# Patient Record
Sex: Male | Born: 1990
Health system: Southern US, Community
[De-identification: ages and names within clinical notes are randomized; demographics above are authoritative.]

## PROBLEM LIST (undated history)

## (undated) DIAGNOSIS — L509 Urticaria, unspecified: Secondary | ICD-10-CM

## (undated) DIAGNOSIS — I10 Essential (primary) hypertension: Secondary | ICD-10-CM

## (undated) HISTORY — DX: Essential (primary) hypertension: I10

## (undated) HISTORY — PX: NO PAST SURGERIES: SHX2092

## (undated) HISTORY — DX: Urticaria, unspecified: L50.9

---

## 2018-07-09 DIAGNOSIS — L509 Urticaria, unspecified: Secondary | ICD-10-CM | POA: Diagnosis not present

## 2018-07-09 DIAGNOSIS — R21 Rash and other nonspecific skin eruption: Secondary | ICD-10-CM | POA: Diagnosis not present

## 2018-07-12 ENCOUNTER — Encounter: Payer: Self-pay | Admitting: Allergy

## 2018-07-12 ENCOUNTER — Ambulatory Visit (INDEPENDENT_AMBULATORY_CARE_PROVIDER_SITE_OTHER): Payer: 59 | Admitting: Allergy

## 2018-07-12 VITALS — BP 130/82 | HR 72 | Temp 98.8°F | Resp 16 | Ht 67.0 in | Wt 233.0 lb

## 2018-07-12 DIAGNOSIS — T7840XD Allergy, unspecified, subsequent encounter: Secondary | ICD-10-CM | POA: Diagnosis not present

## 2018-07-12 DIAGNOSIS — L508 Other urticaria: Secondary | ICD-10-CM | POA: Diagnosis not present

## 2018-07-12 NOTE — Progress Notes (Signed)
New Patient Note  RE: Patrick Travis MRN: [id number] DOB: 09/26/1990 Date of Office Visit: 07/12/2018  Referring provider: No ref. provider found Primary care provider: Abigail Miyamoto, MD   Chief Complaint: hives  History of present illness: Patrick Travis is a 27 y.o. male presenting today for evaluation of urticaria.    2 days ago he broke out in hives around 9pm.  He states this was the first time he has ever had hives in his life.  The hives were on his stomach, flanks, arms, back and top of his thighs.  Hives were itchy.  He states no swelling, no joint aches/pains, hives have not left any marks/bruising.  He does report very mild SOB with the hives but states it wasn't really an issue.  He states the day the hives started he wore a new jacket with polyester that he didn't wash prior to use.  He did not wear the jacket yesterday and did not have hives.  He reports having beef stew for dinner the night the hives started.  The next day he reports having [place] stuffing and also recalls having vienna sausages as well.  He states he has had tick bites years ago but believes they were deer ticks.   He did have hives for 2 days and states on day 2 he had 2 episodes of hives that each lasted about 2 hours but was taking medication as prescribed by ED.   He went to the ED on 07/09/18 and was treated with solumedrol, benadryl and pepcid.  Per ED report his mother found a puncture wound on lt posterior neck thus concerned for reaction after bite. He was prescribed an epipen as well as prednisone pack.  He was also advised to take zyrtec 3 times a day, pepcid 2 times a day and benadyrl prn.  He state she has been taking zyrtec 2 times a day, pepcid once a day with zyrtec once daily, benadryl as needed.  States thought zyrtec 3 times a day was too much.  He has not had any hives today or yesterday.   He denies any new medications prior to onset of hives, no new foods, no change in  soaps/lotions/detergents/body products.  Denies any preceding illnesses.    He does report sneezing and running nose during pollen seasons.  No history of eczema, food allergy.  He states he was told he had childhood asthma but states has not used an inhaler for at least 15-20 years.    Review of systems: Review of Systems  Constitutional: Negative for chills, fever and malaise/fatigue.  HENT: Negative for congestion, ear discharge, ear pain, nosebleeds, sinus pain and sore throat.   Eyes: Negative for pain, discharge and redness.  Respiratory: Positive for shortness of breath. Negative for cough, sputum production and wheezing.   Cardiovascular: Negative for chest pain.  Gastrointestinal: Negative for abdominal pain, constipation, diarrhea, heartburn, nausea and vomiting.  Musculoskeletal: Negative for joint pain.  Skin: Positive for itching and rash.  Neurological: Negative for headaches.    All other systems negative unless noted above in HPI  Past medical history: Past Medical History:  Diagnosis Date  . High blood pressure   . Urticaria     Past surgical history: History reviewed. No pertinent surgical history.  Family history:  Family History  Problem Relation Age of Onset  . High blood pressure Mother   . High blood pressure Father     Social history: Lives in a home with  carpeting with electric heating and central cooling.  1 dog in the home.  No concern for water damage, mildew or roaches in the home.  He is an International aid/development worker.  Has smoking history from 2005-2016 cigarettes 1 pack/day.   Medication List: Allergies as of 07/12/2018   No Known Allergies     Medication List        Accurate as of 07/12/18 10:49 AM. Always use your most recent med list.          diphenhydrAMINE 25 mg capsule Commonly known as:  BENADRYL Take by mouth.   enalapril 10 MG tablet Commonly known as:  VASOTEC TK 1 T PO D   EPINEPHrine 0.3 mg/0.3 mL Soaj injection Commonly  known as:  EPI-PEN STICK INTO MID FRONT THIGH IF LIGHTHEADED OR TROUBLE BREATHING IN SETTING OF ALLERGIC REACTION   ZYRTEC ALLERGY 10 MG tablet Generic drug:  cetirizine Take 10 mg by mouth daily as needed for allergies.       Known medication allergies: No Known Allergies   Physical examination: Blood pressure 130/82, pulse 72, temperature 98.8 F (37.1 C), temperature source Oral, resp. rate 16, height 5\' 7"  (1.702 m), weight 233 lb (105.7 kg).  General: Alert, interactive, in no acute distress. HEENT: PERRLA, TMs pearly gray, turbinates non-edematous without discharge, post-pharynx non erythematous. Neck: Supple without lymphadenopathy. Lungs: Clear to auscultation without wheezing, rhonchi or rales. {no increased work of breathing. CV: Normal S1, S2 without murmurs. Abdomen: Nondistended, nontender. Skin: Warm and dry, without lesions or rashes. Extremities:  No clubbing, cyanosis or edema. Neuro:   Grossly intact.  Diagnositics/Labs: Allergy testing: unable to perform due to recent symptoms and antihistamine use   Assessment and plan:   Hives/Allergic reaction    - Acute hives with no identifiable trigger at this time.  However concerned for possible alpha gal allergy with red meat consumption day of onset of hives   - will obtain following labs: alpha gal panel, tryptase level, environment panel   - at this time would continue Zyrtec 10mg  1 tablet twice a day with Pepcid 1 tablet twice a day.   Reserve benadryl for breakthrough symptoms   - follow emergency action plan in case of severe symptoms and you have access to an Epipen   - pending labs you may warrant patch testing to determine if symptoms are triggered by the jacket however will need to be off prednisone for about 4-6 weeks before patch testing could be performed.    - also if skin testing is needed will need to wait 4-6 weeks from initial reaction and would need to hold all antihistamines for 3 days prior to  any skin testing visit.    Follow-up 2-3 months or sooner if needed  I appreciate the opportunity to take part in Zadkiel's care. Please do not hesitate to contact me with questions.  Sincerely,   Margo Aye, MD Allergy/Immunology [facility]

## 2018-07-12 NOTE — Patient Instructions (Signed)
Hives/Allergic reaction    - Acute hives with no identifiable trigger at this time.  However concerned for possible alpha gal allergy with red meat consumption day of onset of hives   - will obtain following labs: alpha gal panel, tryptase level, environment panel   - at this time would continue Zyrtec 10mg  1 tablet twice a day with Pepcid 1 tablet twice a day.   Reserve benadryl for breakthrough symptoms   - follow emergency action plan in case of severe symptoms and you have access to an Epipen   - pending labs you may warrant patch testing to determine if symptoms are triggered by the jacket however will need to be off prednisone for about 4-6 weeks before patch testing could be performed.    - also if skin testing is needed will need to wait 4-6 weeks from initial reaction and would need to hold all antihistamines for 3 days prior to any skin testing visit.    Follow-up 2-3 months or sooner if needed

## 2018-07-16 LAB — ALLERGENS W/TOTAL IGE AREA 2
Aspergillus Fumigatus IgE: <0.1 kU/L
Bermuda Grass IgE: <0.1 kU/L
COCKROACH, GERMAN IGE: 4.23 kU/L — AB
Cat Dander IgE: <0.1 kU/L
Cladosporium Herbarum IgE: <0.1 kU/L
Cottonwood IgE: <0.1 kU/L
D001-IGE D PTERONYSSINUS: 14.8 kU/L — AB
D002-IGE D FARINAE: 10.3 kU/L — AB
Dog Dander IgE: 0.16 kU/L — AB
Elm, American IgE: <0.1 kU/L
IgE (Immunoglobulin E), Serum: 722 IU/mL — ABNORMAL HIGH (ref 6–495)
Johnson Grass IgE: <0.1 kU/L
Pecan, Hickory IgE: <0.1 kU/L
Penicillium Chrysogen IgE: <0.1 kU/L
Pigweed, Rough IgE: <0.1 kU/L
T006-IGE CEDAR, MOUNTAIN: 0.19 kU/L — AB
T007-IGE OAK, WHITE: 0.1 kU/L — AB
Timothy Grass IgE: 0.14 kU/L — AB
W001-IGE RAGWEED, SHORT: 1.1 kU/L — AB
White Mulberry IgE: <0.1 kU/L

## 2018-07-16 LAB — ALPHA-GAL PANEL
Beef (Bos spp) IgE: <0.1 kU/L (ref <0.35)
Class Interpretation: 0
LAMB CLASS INTERPRETATION: 0
Lamb/Mutton (Ovis spp) IgE: <0.1 kU/L (ref <0.35)
PORK CLASS INTERPRETATION: 0

## 2018-07-16 LAB — TRYPTASE: Tryptase: 6.4 ug/L (ref 2.2–13.2)

## 2018-07-21 ENCOUNTER — Telehealth: Payer: Self-pay | Admitting: Allergy

## 2018-07-21 NOTE — Telephone Encounter (Signed)
Patrick Travis called and would like the results to him blood work.

## 2018-10-26 DIAGNOSIS — I10 Essential (primary) hypertension: Secondary | ICD-10-CM | POA: Diagnosis not present

## 2018-12-02 DIAGNOSIS — H65192 Other acute nonsuppurative otitis media, left ear: Secondary | ICD-10-CM | POA: Diagnosis not present

## 2020-01-11 ENCOUNTER — Other Ambulatory Visit: Payer: Self-pay | Admitting: Legal Medicine

## 2020-02-08 ENCOUNTER — Telehealth (INDEPENDENT_AMBULATORY_CARE_PROVIDER_SITE_OTHER): Payer: 59 | Admitting: Legal Medicine

## 2020-02-08 ENCOUNTER — Encounter: Payer: Self-pay | Admitting: Legal Medicine

## 2020-02-08 VITALS — Temp 99.1°F | Ht 69.0 in | Wt 240.0 lb

## 2020-02-08 DIAGNOSIS — J0100 Acute maxillary sinusitis, unspecified: Secondary | ICD-10-CM

## 2020-02-08 MED ORDER — PREDNISONE 5 MG PO TABS: 5.0000 mg | ORAL_TABLET | Freq: Every day | ORAL | 0 refills | Status: DC

## 2020-02-08 MED ORDER — CLARITHROMYCIN 500 MG PO TABS: 500.0000 mg | ORAL_TABLET | Freq: Two times a day (BID) | ORAL | 0 refills | Status: DC

## 2020-02-08 NOTE — Progress Notes (Signed)
Virtual Visit via Telephone Note   This visit type was conducted due to national recommendations for restrictions regarding the COVID-19 Pandemic (e.g. social distancing) in an effort to limit this patient's exposure and mitigate transmission in our community.  Due to his co-morbid illnesses, this patient is at least at moderate risk for complications without adequate follow up.  This format is felt to be most appropriate for this patient at this time.  The patient did not have access to video technology/had technical difficulties with video requiring transitioning to audio format only (telephone).  All issues noted in this document were discussed and addressed.  No physical exam could be performed with this format.  Patient verbally consented to a telehealth visit.   Date:  02/08/2020   ID:  Patrick Travis, DOB [birth year], MRN [id number]  Patient Location: Home Provider Location: Office  PCP:  Abigail Miyamoto, MD   Evaluation Performed:  New Patient Evaluation  Chief Complaint:  Sinusitis and cough  History of Present Illness:    Patrick Travis is a 29 y.o. male with sinus congestion and sore throat.  Started 4 days ago.  Cold chills. Cough in non productive bur keeps him up at night.  The patient does not have symptoms concerning for COVID-19 infection (fever, chills, cough, or new shortness of breath).    Past Medical History:  Diagnosis Date  . High blood pressure   . Urticaria     History reviewed. No pertinent surgical history.  Family History  Problem Relation Age of Onset  . High blood pressure Mother   . High blood pressure Father     Social History   Socioeconomic History  . Marital status: Single    Spouse name: Not on file  . Number of children: Not on file  . Years of education: Not on file  . Highest education level: Not on file  Occupational History  . Not on file  Tobacco Use  . Smoking status: Former Smoker    Packs/day: 1.00    Years: 11.00   Pack years: 11.00    Types: Cigarettes    Quit date: 2016    Years since quitting: 5.4  . Smokeless tobacco: Never Used  Substance and Sexual Activity  . Alcohol use: Yes    Comment: Seldom  . Drug use: Never  . Sexual activity: Not on file  Other Topics Concern  . Not on file  Social History Narrative  . Not on file   Social Determinants of Health   Financial Resource Strain:   . Difficulty of Paying Living Expenses:   Food Insecurity:   . Worried About Programme researcher, broadcasting/film/video in the Last Year:   . Barista in the Last Year:   Transportation Needs:   . Freight forwarder (Medical):   Marland Kitchen Lack of Transportation (Non-Medical):   Physical Activity:   . Days of Exercise per Week:   . Minutes of Exercise per Session:   Stress:   . Feeling of Stress :   Social Connections:   . Frequency of Communication with Friends and Family:   . Frequency of Social Gatherings with Friends and Family:   . Attends Religious Services:   . Active Member of Clubs or Organizations:   . Attends Banker Meetings:   Marland Kitchen Marital Status:   Intimate Partner Violence:   . Fear of Current or Ex-Partner:   . Emotionally Abused:   Marland Kitchen Physically Abused:   . Sexually  Abused:     Outpatient Medications Prior to Visit  Medication Sig Dispense Refill  . cetirizine (ZYRTEC ALLERGY) 10 MG tablet Take 10 mg by mouth daily as needed for allergies.    . diphenhydrAMINE (BENADRYL) 25 mg capsule Take by mouth.    . enalapril (VASOTEC) 10 MG tablet TAKE 1 TABLET(10 MG) BY MOUTH 1 TIME PER DAY 30 tablet 3  . EPINEPHrine 0.3 mg/0.3 mL IJ SOAJ injection STICK INTO MID FRONT THIGH IF LIGHTHEADED OR TROUBLE BREATHING IN SETTING OF ALLERGIC REACTION  0   No facility-administered medications prior to visit.   .med Allergies:   Patient has no known allergies.   Social History   Tobacco Use  . Smoking status: Former Smoker    Packs/day: 1.00    Years: 11.00    Pack years: 11.00    Types:  Cigarettes    Quit date: 2016    Years since quitting: 5.4  . Smokeless tobacco: Never Used  Substance Use Topics  . Alcohol use: Yes    Comment: Seldom  . Drug use: Never     Review of Systems  Constitutional: Positive for chills and malaise/fatigue.  HENT: Positive for congestion, sinus pain and sore throat.   Respiratory: Positive for cough.   Cardiovascular: Negative.   Gastrointestinal: Negative.   Genitourinary: Negative.   Musculoskeletal: Negative.      Labs/Other Tests and Data Reviewed:    Recent Labs: No results found for requested labs within last 8760 hours.   Recent Lipid Panel No results found for: CHOL, TRIG, HDL, CHOLHDL, LDLCALC, LDLDIRECT  Wt Readings from Last 3 Encounters:  02/08/20 240 lb (108.9 kg)  07/12/18 233 lb (105.7 kg)     Objective:    Vital Signs:  Temp 99.1 F (37.3 C)   Ht 5\' 9"  (1.753 m)   Wt 240 lb (108.9 kg)   BMI 35.44 kg/m    Physical Exam VS reviewed  ASSESSMENT & PLAN:   Acute Sinusitis: Patient is having sinus congestion and cough with sore throat, no COVID exposure.  I called in Biaxin and prednisone pack.  Use robitussin DM  No orders of the defined types were placed in this encounter.    Meds ordered this encounter  Medications  . clarithromycin (BIAXIN) 500 MG tablet    Sig: Take 1 tablet (500 mg total) by mouth 2 (two) times daily.    Dispense:  20 tablet    Refill:  0  . predniSONE (DELTASONE) 5 MG tablet    Sig: Take 1 tablet (5 mg total) by mouth daily with breakfast. Take 6ills first day , then 5 pills day 2 and then cut down one pill day until gone    Dispense:  21 tablet    Refill:  0    COVID-19 Education: The signs and symptoms of COVID-19 were discussed with the patient and how to seek care for testing (follow up with PCP or arrange E-visit). The importance of social distancing was discussed today.  Time:   Today, I have spent 20 minutes with the patient with telehealth technology discussing the  above problems.    Follow Up:  In Person prn  Signed, Reinaldo Meeker, MD  02/08/2020 9:41 AM    [place]

## 2020-04-17 ENCOUNTER — Other Ambulatory Visit: Payer: Self-pay | Admitting: Legal Medicine

## 2020-05-06 ENCOUNTER — Ambulatory Visit (INDEPENDENT_AMBULATORY_CARE_PROVIDER_SITE_OTHER): Payer: 59 | Admitting: Legal Medicine

## 2020-05-06 ENCOUNTER — Encounter: Payer: Self-pay | Admitting: Legal Medicine

## 2020-05-06 VITALS — Ht 69.0 in | Wt 240.0 lb

## 2020-05-06 DIAGNOSIS — J028 Acute pharyngitis due to other specified organisms: Secondary | ICD-10-CM

## 2020-05-06 DIAGNOSIS — J029 Acute pharyngitis, unspecified: Secondary | ICD-10-CM | POA: Insufficient documentation

## 2020-05-06 MED ORDER — PREDNISONE 10 MG (21) PO TBPK: ORAL_TABLET | ORAL | 0 refills | Status: DC

## 2020-05-06 MED ORDER — AZITHROMYCIN 250 MG PO TABS: ORAL_TABLET | ORAL | 0 refills | Status: DC

## 2020-05-06 NOTE — Progress Notes (Signed)
Virtual Visit via Telephone Note   This visit type was conducted due to national recommendations for restrictions regarding the COVID-19 Pandemic (e.g. social distancing) in an effort to limit this patient's exposure and mitigate transmission in our community.  Due to his co-morbid illnesses, this patient is at least at moderate risk for complications without adequate follow up.  This format is felt to be most appropriate for this patient at this time.  The patient did not have access to video technology/had technical difficulties with video requiring transitioning to audio format only (telephone).  All issues noted in this document were discussed and addressed.  No physical exam could be performed with this format.  Patient verbally consented to a telehealth visit.   Date:  05/06/2020   ID:  Patrick Travis, DOB May 18, 1991, MRN [id number]  Patient Location: Home Provider Location: Office/Clinic  PCP:  Abigail Miyamoto, MD   Evaluation Performed:  New Patient Evaluation  Chief Complaint:  Sore throat 3 days  History of Present Illness:    Patrick Travis is a 29 y.o. male with sore throat for 3 days with hoarseness.  No fever or chills.  The patient does not have symptoms concerning for COVID-19 infection (fever, chills, cough, or new shortness of breath).    Past Medical History:  Diagnosis Date  . High blood pressure   . Urticaria     History reviewed. No pertinent surgical history.  Family History  Problem Relation Age of Onset  . High blood pressure Mother   . High blood pressure Father     Social History   Socioeconomic History  . Marital status: Single    Spouse name: Not on file  . Number of children: Not on file  . Years of education: Not on file  . Highest education level: Not on file  Occupational History  . Not on file  Tobacco Use  . Smoking status: Former Smoker    Packs/day: 1.00    Years: 11.00    Pack years: 11.00    Types: Cigarettes    Quit  date: 2016    Years since quitting: 5.6  . Smokeless tobacco: Never Used  Vaping Use  . Vaping Use: Every day  Substance and Sexual Activity  . Alcohol use: Yes    Comment: Seldom  . Drug use: Never  . Sexual activity: Not Currently  Other Topics Concern  . Not on file  Social History Narrative  . Not on file   Social Determinants of Health   Financial Resource Strain:   . Difficulty of Paying Living Expenses: Not on file  Food Insecurity:   . Worried About Programme researcher, broadcasting/film/video in the Last Year: Not on file  . Ran Out of Food in the Last Year: Not on file  Transportation Needs:   . Lack of Transportation (Medical): Not on file  . Lack of Transportation (Non-Medical): Not on file  Physical Activity:   . Days of Exercise per Week: Not on file  . Minutes of Exercise per Session: Not on file  Stress:   . Feeling of Stress : Not on file  Social Connections:   . Frequency of Communication with Friends and Family: Not on file  . Frequency of Social Gatherings with Friends and Family: Not on file  . Attends Religious Services: Not on file  . Active Member of Clubs or Organizations: Not on file  . Attends Banker Meetings: Not on file  . Marital Status: Not  on file  Intimate Partner Violence:   . Fear of Current or Ex-Partner: Not on file  . Emotionally Abused: Not on file  . Physically Abused: Not on file  . Sexually Abused: Not on file    Outpatient Medications Prior to Visit  Medication Sig Dispense Refill  . cetirizine (ZYRTEC ALLERGY) 10 MG tablet Take 10 mg by mouth daily as needed for allergies.    . diphenhydrAMINE (BENADRYL) 25 mg capsule Take by mouth.    . enalapril (VASOTEC) 10 MG tablet TAKE 1 TABLET(10 MG) BY MOUTH EVERY DAY 30 tablet 3  . EPINEPHrine 0.3 mg/0.3 mL IJ SOAJ injection STICK INTO MID FRONT THIGH IF LIGHTHEADED OR TROUBLE BREATHING IN SETTING OF ALLERGIC REACTION  0  . clarithromycin (BIAXIN) 500 MG tablet Take 1 tablet (500 mg total) by  mouth 2 (two) times daily. 20 tablet 0  . predniSONE (DELTASONE) 5 MG tablet Take 1 tablet (5 mg total) by mouth daily with breakfast. Take 6ills first day , then 5 pills day 2 and then cut down one pill day until gone 21 tablet 0   No facility-administered medications prior to visit.   .med Allergies:   Amoxicillin   Social History   Tobacco Use  . Smoking status: Former Smoker    Packs/day: 1.00    Years: 11.00    Pack years: 11.00    Types: Cigarettes    Quit date: 2016    Years since quitting: 5.6  . Smokeless tobacco: Never Used  Vaping Use  . Vaping Use: Every day  Substance Use Topics  . Alcohol use: Yes    Comment: Seldom  . Drug use: Never     Review of Systems  Constitutional: Negative for chills and fever.  HENT: Positive for congestion and sore throat.   Eyes: Negative.   Respiratory: Positive for cough. Negative for shortness of breath and wheezing.   Cardiovascular: Negative for chest pain and palpitations.  Gastrointestinal: Negative.   Genitourinary: Negative.   Musculoskeletal: Negative.   Neurological: Negative.   Psychiatric/Behavioral: Negative.      Labs/Other Tests and Data Reviewed:    Recent Labs: No results found for requested labs within last 8760 hours.   Recent Lipid Panel No results found for: CHOL, TRIG, HDL, CHOLHDL, LDLCALC, LDLDIRECT  Wt Readings from Last 3 Encounters:  05/06/20 240 lb (108.9 kg)  02/08/20 240 lb (108.9 kg)  07/12/18 233 lb (105.7 kg)     Objective:    Vital Signs:  Ht 5\' 9"  (1.753 m)   Wt 240 lb (108.9 kg)   BMI 35.44 kg/m    Physical Exam VS reviewed  ASSESSMENT & PLAN:   Diagnoses and all orders for this visit: Pharyngitis due to other organism -     azithromycin (ZITHROMAX) 250 MG tablet; 2 tablets on day 1, then 1 tablet daily on days 2-6. -     predniSONE (STERAPRED UNI-PAK 21 TAB) 10 MG (21) TBPK tablet; Take 6ills first day , then 5 pills day 2 and then cut down one pill day until gone  He  is to gargle and can use tylenol or ibuprofen.  Cal it fever occurs or worsening symptoms      COVID-19 Education: The signs and symptoms of COVID-19 were discussed with the patient and how to seek care for testing (follow up with PCP or arrange E-visit). The importance of social distancing was discussed today.  Time:   Today, I have spent 20 minutes with the  patient with telehealth technology discussing the above problems.    Follow Up:  In Person prn  Signed, Brent Bulla, MD  05/06/2020 10:45 AM    [facility]

## 2020-08-31 ENCOUNTER — Other Ambulatory Visit: Payer: Self-pay | Admitting: Legal Medicine

## 2021-03-11 ENCOUNTER — Other Ambulatory Visit: Payer: Self-pay

## 2021-03-11 ENCOUNTER — Ambulatory Visit (HOSPITAL_COMMUNITY)
Admission: EM | Admit: 2021-03-11 | Discharge: 2021-03-11 | Disposition: A | Discharge status: Home / Self Care | Payer: 59 | Attending: Emergency Medicine | Admitting: Emergency Medicine

## 2021-03-11 ENCOUNTER — Ambulatory Visit (INDEPENDENT_AMBULATORY_CARE_PROVIDER_SITE_OTHER): Payer: 59

## 2021-03-11 ENCOUNTER — Encounter (HOSPITAL_COMMUNITY): Payer: Self-pay

## 2021-03-11 DIAGNOSIS — M79675 Pain in left toe(s): Secondary | ICD-10-CM | POA: Diagnosis not present

## 2021-03-11 DIAGNOSIS — S92535B Nondisplaced fracture of distal phalanx of left lesser toe(s), initial encounter for open fracture: Secondary | ICD-10-CM

## 2021-03-11 NOTE — ED Provider Notes (Signed)
[facility]    CSN: [id number] Arrival date & time: 03/11/21  1017      History   Chief Complaint Chief Complaint  Patient presents with   Toe Injury    HPI Kelli Egolf is a 30 y.o. male.   Patient presents with 4th left toe bruising, decreased sensation, limited ROM and pain when bearing weight after toe hit rock 3 days ago. Has not attempted treatment.    Past Medical History:  Diagnosis Date   High blood pressure    Urticaria     Patient Active Problem List   Diagnosis Date Noted   Pharyngitis 05/06/2020    History reviewed. No pertinent surgical history.     Home Medications    Prior to Admission medications   Medication Sig Start Date End Date Taking? Authorizing Provider  azithromycin (ZITHROMAX) 250 MG tablet 2 tablets on day 1, then 1 tablet daily on days 2-6. 05/06/20   Abigail Miyamoto, MD  cetirizine (ZYRTEC ALLERGY) 10 MG tablet Take 10 mg by mouth daily as needed for allergies.    [provider]  diphenhydrAMINE (BENADRYL) 25 mg capsule Take by mouth.    [provider]  enalapril (VASOTEC) 10 MG tablet TAKE 1 TABLET(10 MG) BY MOUTH EVERY DAY 08/31/20   Abigail Miyamoto, MD  EPINEPHrine 0.3 mg/0.3 mL IJ SOAJ injection STICK INTO MID FRONT THIGH IF LIGHTHEADED OR TROUBLE BREATHING IN SETTING OF ALLERGIC REACTION 07/10/18   [provider]  predniSONE (STERAPRED UNI-PAK 21 TAB) 10 MG (21) TBPK tablet Take 6ills first day , then 5 pills day 2 and then cut down one pill day until gone 05/06/20   Abigail Miyamoto, MD    Family History Family History  Problem Relation Age of Onset   High blood pressure Mother    High blood pressure Father     Social History Social History   Tobacco Use   Smoking status: Former    Packs/day: 1.00    Years: 11.00    Pack years: 11.00    Types: Cigarettes    Quit date: 2016    Years since quitting: 6.5   Smokeless tobacco: Never  Vaping Use   Vaping Use:  Every day  Substance Use Topics   Alcohol use: Yes    Comment: Seldom   Drug use: Never     Allergies   Amoxicillin   Review of Systems Review of Systems  Constitutional: Negative.   Respiratory: Negative.    Cardiovascular: Negative.   Musculoskeletal:  Positive for gait problem and joint swelling. Negative for arthralgias, back pain, myalgias, neck pain and neck stiffness.  Skin: Negative.   Neurological:  Positive for numbness. Negative for dizziness, tremors, seizures, syncope, facial asymmetry, speech difficulty, weakness, light-headedness and headaches.    Physical Exam Triage Vital Signs ED Triage Vitals  Enc Vitals Group     BP 03/11/21 1120 132/80     Pulse Rate 03/11/21 1120 90     Resp 03/11/21 1120 18     Temp 03/11/21 1120 98.4 F (36.9 C)     Temp Source 03/11/21 1120 Oral     SpO2 03/11/21 1120 100 %     Weight --      Height --      Head Circumference --      Peak Flow --      Pain Score 03/11/21 1122 7     Pain Loc --      Pain Edu? --  Excl. in GC? --    No data found.  Updated Vital Signs BP 132/80 (BP Location: Right Arm)   Pulse 90   Temp 98.4 F (36.9 C) (Oral)   Resp 18   SpO2 100%   Visual Acuity Right Eye Distance:   Left Eye Distance:   Bilateral Distance:    Right Eye Near:   Left Eye Near:    Bilateral Near:     Physical Exam Constitutional:      Appearance: Normal appearance. He is normal weight.  HENT:     Head: Normocephalic.  Eyes:     Extraocular Movements: Extraocular movements intact.  Pulmonary:     Effort: Pulmonary effort is normal.  Musculoskeletal:     Comments: Bruising over dorsum of 4th left toe, limited range of motion due to pain, decreased sensation over dorsum of 4th toe and at base of 4th metatarsal, swelling noted at base of metatarsal   Neurological:     Mental Status: He is alert and oriented to person, place, and time. Mental status is at baseline.  Psychiatric:        Mood and Affect:  Mood normal.        Behavior: Behavior normal.     UC Treatments / Results  Labs (all labs ordered are listed, but only abnormal results are displayed) Labs Reviewed - No data to display  EKG   Radiology No results found.  Procedures Procedures (including critical care time)  Medications Ordered in UC Medications - No data to display  Initial Impression / Assessment and Plan / UC Course  I have reviewed the triage vital signs and the nursing notes.  Pertinent labs & imaging results that were available during my care of the patient were reviewed by me and considered in my medical decision making (see chart for details).  Nondisplaced fracture of distal phalanx of 4th left toe  X-ray to confirm fracture   Buddy tape with post op shoe until seen by orthopedic specialist Declined prescription, will take otc ibuprofen 600- 800 mg tid prn  Orthopedic referral given to patient   Final Clinical Impressions(s) / UC Diagnoses   Final diagnoses:  None   Discharge Instructions   None    ED Prescriptions   None    PDMP not reviewed this encounter.   Valinda Hoar, NP 03/11/21 1316

## 2021-03-11 NOTE — Discharge Instructions (Addendum)
Your fracture is at the distal phalanx of your 4th toe   Follow up with orthopedic specialist within the next 2 weeks for evaluation   Wear buddy tape until seen by orthopedic specialist, wear shoe while walking or standing  Can use 600-800 mg of ibuprofen every 8 hours as needed for pain, take with food to prevent stomach irritation

## 2021-03-11 NOTE — ED Triage Notes (Signed)
Pt presents with left 4th toe injury [place].

## 2021-04-22 ENCOUNTER — Encounter: Payer: Self-pay | Admitting: Legal Medicine

## 2021-04-22 ENCOUNTER — Telehealth (INDEPENDENT_AMBULATORY_CARE_PROVIDER_SITE_OTHER): Payer: Self-pay | Admitting: Legal Medicine

## 2021-04-22 VITALS — BP 131/82 | HR 89 | Temp 103.7°F | Ht 69.0 in | Wt 240.0 lb

## 2021-04-22 DIAGNOSIS — R6889 Other general symptoms and signs: Secondary | ICD-10-CM

## 2021-04-22 DIAGNOSIS — Z9189 Other specified personal risk factors, not elsewhere classified: Secondary | ICD-10-CM

## 2021-04-22 DIAGNOSIS — J019 Acute sinusitis, unspecified: Secondary | ICD-10-CM

## 2021-04-22 LAB — POCT INFLUENZA A/B
Influenza A, POC: NEGATIVE
Influenza B, POC: NEGATIVE

## 2021-04-22 LAB — POC COVID19 BINAXNOW: SARS Coronavirus 2 Ag: NEGATIVE

## 2021-04-22 MED ORDER — AZITHROMYCIN 250 MG PO TABS: ORAL_TABLET | ORAL | 0 refills | Status: AC

## 2021-04-22 MED ORDER — PREDNISONE 10 MG (21) PO TBPK: ORAL_TABLET | ORAL | 0 refills | Status: DC

## 2021-04-22 NOTE — Progress Notes (Signed)
Virtual Visit via Telephone Note   This visit type was conducted due to national recommendations for restrictions regarding the COVID-19 Pandemic (e.g. social distancing) in an effort to limit this patient's exposure and mitigate transmission in our community.  Due to his co-morbid illnesses, this patient is at least at moderate risk for complications without adequate follow up.  This format is felt to be most appropriate for this patient at this time.  The patient did not have access to video technology/had technical difficulties with video requiring transitioning to audio format only (telephone).  All issues noted in this document were discussed and addressed.  No physical exam could be performed with this format.  Patient verbally consented to a telehealth visit.   Date:  04/22/2021   ID:  Patrick Travis, DOB 1990-11-22, MRN [id number]  Patient Location: Home Provider Location: Office/Clinic  PCP:  Abigail Miyamoto, MD   Evaluation Performed:  New Patient Evaluation  Chief Complaint:  flu-like syndrome  History of Present Illness:    Patrick Travis is a 30 y.o. male with negative covid but 2 days with sudden onset of fever 103, myalgias and nonproductive cough, headache.  The patient does have symptoms concerning for COVID-19 infection (fever, chills, cough, or new shortness of breath).    Past Medical History:  Diagnosis Date   High blood pressure    Urticaria     History reviewed. No pertinent surgical history.  Family History  Problem Relation Age of Onset   High blood pressure Mother    High blood pressure Father     Social History   Socioeconomic History   Marital status: Single    Spouse name: Not on file   Number of children: Not on file   Years of education: Not on file   Highest education level: Not on file  Occupational History   Not on file  Tobacco Use   Smoking status: Former    Packs/day: 1.00    Years: 11.00    Pack years: 11.00    Types:  Cigarettes    Quit date: 2016    Years since quitting: 6.6   Smokeless tobacco: Never  Vaping Use   Vaping Use: Every day  Substance and Sexual Activity   Alcohol use: Yes    Comment: Seldom   Drug use: Never   Sexual activity: Not Currently  Other Topics Concern   Not on file  Social History Narrative   Not on file   Social Determinants of Health   Financial Resource Strain: Not on file  Food Insecurity: Not on file  Transportation Needs: Not on file  Physical Activity: Not on file  Stress: Not on file  Social Connections: Not on file  Intimate Partner Violence: Not on file    Outpatient Medications Prior to Visit  Medication Sig Dispense Refill   cetirizine (ZYRTEC) 10 MG tablet Take 10 mg by mouth daily as needed for allergies.     diphenhydrAMINE (BENADRYL) 25 mg capsule Take by mouth.     enalapril (VASOTEC) 10 MG tablet TAKE 1 TABLET(10 MG) BY MOUTH EVERY DAY 90 tablet 2   EPINEPHrine 0.3 mg/0.3 mL IJ SOAJ injection STICK INTO MID FRONT THIGH IF LIGHTHEADED OR TROUBLE BREATHING IN SETTING OF ALLERGIC REACTION  0   No facility-administered medications prior to visit.    Allergies:   Amoxicillin   Social History   Tobacco Use   Smoking status: Former    Packs/day: 1.00    Years: 11.00  Pack years: 11.00    Types: Cigarettes    Quit date: 2016    Years since quitting: 6.6   Smokeless tobacco: Never  Vaping Use   Vaping Use: Every day  Substance Use Topics   Alcohol use: Yes    Comment: Seldom   Drug use: Never     Review of Systems  Constitutional:  Positive for chills, fever and malaise/fatigue.  HENT:  Positive for congestion.   Eyes: Negative.   Respiratory:  Positive for cough.   Cardiovascular: Negative.   Gastrointestinal: Negative.   Genitourinary: Negative.   Musculoskeletal:  Positive for myalgias.  Neurological:  Positive for headaches.    Labs/Other Tests and Data Reviewed:    Recent Labs: No results found for requested labs  within last 8760 hours.   Recent Lipid Panel No results found for: CHOL, TRIG, HDL, CHOLHDL, LDLCALC, LDLDIRECT  Wt Readings from Last 3 Encounters:  04/22/21 240 lb (108.9 kg)  05/06/20 240 lb (108.9 kg)  02/08/20 240 lb (108.9 kg)     Objective:    Vital Signs:  BP 131/82   Pulse 89   Temp (!) 103.7 F (39.8 C)   Ht 5\' 9"  (1.753 m)   Wt 240 lb (108.9 kg)   BMI 35.44 kg/m    Physical Exam reviewed  ASSESSMENT & PLAN:   Diagnoses and all orders for this visit: Flu-like symptoms -     Influenza A/B Negative tests for A/B At increased risk of exposure to COVID-19 virus -     POC COVID-19  Negative Covid Treat as sinusitis with z-pack and prednisone pack, note for work       COVID-19 Education: The signs and symptoms of COVID-19 were discussed with the patient and how to seek care for testing (follow up with PCP or arrange E-visit). The importance of social distancing was discussed today.   I spent 20 minutes dedicated to the care of this patient on the date of this encounter to include face-to-face time with the patient, as well as:   Follow Up:  In Person prn  Signed,  , MD  04/22/2021 11:04 AM    [facility]

## 2021-05-22 ENCOUNTER — Other Ambulatory Visit: Payer: Self-pay | Admitting: Legal Medicine

## 2021-10-09 ENCOUNTER — Ambulatory Visit (HOSPITAL_COMMUNITY)
Admission: EM | Admit: 2021-10-09 | Discharge: 2021-10-09 | Disposition: A | Discharge status: Home / Self Care | Payer: BC Managed Care – PPO | Attending: Student | Admitting: Student

## 2021-10-09 ENCOUNTER — Encounter (HOSPITAL_COMMUNITY): Payer: Self-pay

## 2021-10-09 ENCOUNTER — Other Ambulatory Visit: Payer: Self-pay

## 2021-10-09 DIAGNOSIS — R112 Nausea with vomiting, unspecified: Secondary | ICD-10-CM

## 2021-10-09 MED ORDER — ONDANSETRON 8 MG PO TBDP: 8.0000 mg | ORAL_TABLET | Freq: Three times a day (TID) | ORAL | 0 refills | Status: DC | PRN

## 2021-10-09 NOTE — ED Provider Notes (Signed)
[facility]    CSN: [id number] Arrival date & time: 10/09/21  0263      History   Chief Complaint Chief Complaint  Patient presents with   Weakness    HPI Patrick Travis is a 31 y.o. male presenting with nausea and vomiting that is slowly resolving on its own.  Medical history noncontributory.  States that he woke up yesterday feeling very sick, vomited over 10 times, describes this is bilious.  Similar episode of watery diarrhea.  States that he feels lightheaded and generalized weakness.  Denies cough, fever/chills, sore throat.  Exposure to daughters to attend daycare and had the same illness.  His wife gave him a Zofran that helped a little bit.  Denies any nausea or vomiting today, now tolerating fluids and food.  Requires a work note.  Denies hematemesis, melena, hematochezia, abdominal pain  HPI  Past Medical History:  Diagnosis Date   High blood pressure    Urticaria     Patient Active Problem List   Diagnosis Date Noted   Pharyngitis 05/06/2020    History reviewed. No pertinent surgical history.     Home Medications    Prior to Admission medications   Medication Sig Start Date End Date Taking? Authorizing Provider  ondansetron (ZOFRAN-ODT) 8 MG disintegrating tablet Take 1 tablet (8 mg total) by mouth every 8 (eight) hours as needed for nausea or vomiting. 10/09/21  Yes Rhys Martini, PA-C  cetirizine (ZYRTEC) 10 MG tablet Take 10 mg by mouth daily as needed for allergies.    [provider]  diphenhydrAMINE (BENADRYL) 25 mg capsule Take by mouth.    [provider]  enalapril (VASOTEC) 10 MG tablet TAKE 1 TABLET(10 MG) BY MOUTH EVERY DAY 05/22/21   Abigail Miyamoto, MD  EPINEPHrine 0.3 mg/0.3 mL IJ SOAJ injection STICK INTO MID FRONT THIGH IF LIGHTHEADED OR TROUBLE BREATHING IN SETTING OF ALLERGIC REACTION 07/10/18   [provider]  predniSONE (STERAPRED UNI-PAK 21 TAB) 10 MG (21) TBPK tablet Take 6ills first day , then 5  pills day 2 and then cut down one pill day until gone 04/22/21   Abigail Miyamoto, MD    Family History Family History  Problem Relation Age of Onset   High blood pressure Mother    High blood pressure Father     Social History Social History   Tobacco Use   Smoking status: Former    Packs/day: 1.00    Years: 11.00    Pack years: 11.00    Types: Cigarettes    Quit date: 2016    Years since quitting: 7.0   Smokeless tobacco: Never  Vaping Use   Vaping Use: Every day  Substance Use Topics   Alcohol use: Yes    Comment: Seldom   Drug use: Never     Allergies   Amoxicillin   Review of Systems Review of Systems  Constitutional:  Negative for appetite change, chills, diaphoresis, fever and unexpected weight change.  HENT:  Negative for congestion, ear pain, sinus pressure, sinus pain, sneezing, sore throat and trouble swallowing.   Respiratory:  Negative for cough, chest tightness and shortness of breath.   Cardiovascular:  Negative for chest pain.  Gastrointestinal:  Positive for nausea and vomiting. Negative for abdominal distention, abdominal pain, anal bleeding, blood in stool, constipation, diarrhea and rectal pain.  Genitourinary:  Negative for dysuria, flank pain, frequency and urgency.  Musculoskeletal:  Negative for back pain and myalgias.  Neurological:  Negative  for dizziness, light-headedness and headaches.  All other systems reviewed and are negative.   Physical Exam Triage Vital Signs ED Triage Vitals  Enc Vitals Group     BP 10/09/21 1022 138/89     Pulse Rate 10/09/21 1022 93     Resp 10/09/21 1022 18     Temp 10/09/21 1022 98.6 F (37 C)     Temp Source 10/09/21 1022 Oral     SpO2 10/09/21 1022 100 %     Weight --      Height --      Head Circumference --      Peak Flow --      Pain Score 10/09/21 1023 0     Pain Loc --      Pain Edu? --      Excl. in GC? --    No data found.  Updated Vital Signs BP 138/89 (BP Location: Right Arm)     Pulse 93    Temp 98.6 F (37 C) (Oral)    Resp 18    SpO2 100%   Visual Acuity Right Eye Distance:   Left Eye Distance:   Bilateral Distance:    Right Eye Near:   Left Eye Near:    Bilateral Near:     Physical Exam Vitals reviewed.  Constitutional:      General: He is not in acute distress.    Appearance: Normal appearance. He is not ill-appearing.  HENT:     Head: Normocephalic and atraumatic.     Mouth/Throat:     Mouth: Mucous membranes are moist.     Comments: Moist mucous membranes Eyes:     Extraocular Movements: Extraocular movements intact.     Pupils: Pupils are equal, round, and reactive to light.  Cardiovascular:     Rate and Rhythm: Normal rate and regular rhythm.     Heart sounds: Normal heart sounds.  Pulmonary:     Effort: Pulmonary effort is normal.     Breath sounds: Normal breath sounds. No wheezing, rhonchi or rales.  Abdominal:     General: Bowel sounds are increased. There is no distension.     Palpations: Abdomen is soft. There is no mass.     Tenderness: There is no abdominal tenderness. There is no right CVA tenderness, left CVA tenderness, guarding or rebound. Negative signs include Murphy's sign, Rovsing's sign and McBurney's sign.     Comments: No tenderners to palpation  Skin:    General: Skin is warm.     Capillary Refill: Capillary refill takes less than 2 seconds.     Comments: Good skin turgor  Neurological:     General: No focal deficit present.     Mental Status: He is alert and oriented to person, place, and time.  Psychiatric:        Mood and Affect: Mood normal.        Behavior: Behavior normal.     UC Treatments / Results  Labs (all labs ordered are listed, but only abnormal results are displayed) Labs Reviewed - No data to display  EKG   Radiology No results found.  Procedures Procedures (including critical care time)  Medications Ordered in UC Medications - No data to display  Initial Impression / Assessment  and Plan / UC Course  I have reviewed the triage vital signs and the nursing notes.  Pertinent labs & imaging results that were available during my care of the patient were reviewed by me and considered in  my medical decision making (see chart for details).     This patient is a very pleasant 31 y.o. year old male presenting with viral gastroenteritis, resolving on its own. Appears fairly well hydrated. Afebrile, nontachy.  Zofran ODT sent. Good hydration, BRAT diet. Work note provided.   ED return precautions discussed. Patient verbalizes understanding and agreement.      Final Clinical Impressions(s) / UC Diagnoses   Final diagnoses:  Nausea and vomiting, unspecified vomiting type     Discharge Instructions      -Take the Zofran (ondansetron) up to 3 times daily for nausea and vomiting. Dissolve one pill under your tongue or between your teeth and your cheek. -Drink plenty of fluids and eat a bland diet     ED Prescriptions     Medication Sig Dispense Auth. Provider   ondansetron (ZOFRAN-ODT) 8 MG disintegrating tablet Take 1 tablet (8 mg total) by mouth every 8 (eight) hours as needed for nausea or vomiting. 20 tablet Rhys MartiniGraham, Kloey Cazarez E, PA-C      PDMP not reviewed this encounter.   Rhys MartiniGraham, Hillard Goodwine E, PA-C 10/09/21 1045

## 2021-10-09 NOTE — Discharge Instructions (Addendum)
-  Take the Zofran (ondansetron) up to 3 times daily for nausea and vomiting. Dissolve one pill under your tongue or between your teeth and your cheek. -Drink plenty of fluids and eat a bland diet   

## 2021-10-09 NOTE — ED Triage Notes (Signed)
Pt c/o n/v/d yesterday with vomiting >10. States today feeling nausea, weakness, dizziness,, and fatigue. States needs a work note.

## 2021-11-18 ENCOUNTER — Other Ambulatory Visit: Payer: Self-pay | Admitting: Legal Medicine

## 2021-11-18 NOTE — Telephone Encounter (Signed)
Refill sent to pharmacy.   

## 2021-12-29 ENCOUNTER — Encounter (HOSPITAL_COMMUNITY): Payer: Self-pay | Admitting: Emergency Medicine

## 2021-12-29 ENCOUNTER — Ambulatory Visit (HOSPITAL_COMMUNITY)
Admission: EM | Admit: 2021-12-29 | Discharge: 2021-12-29 | Disposition: A | Discharge status: Home / Self Care | Payer: BC Managed Care – PPO | Attending: Emergency Medicine | Admitting: Emergency Medicine

## 2021-12-29 DIAGNOSIS — L237 Allergic contact dermatitis due to plants, except food: Secondary | ICD-10-CM

## 2021-12-29 MED ORDER — PREDNISONE 10 MG (21) PO TBPK: ORAL_TABLET | Freq: Every day | ORAL | 0 refills | Status: DC

## 2021-12-29 NOTE — ED Provider Notes (Signed)
?[facility] ? ? ? ?CSN: [id number] ?Arrival date & time: 12/29/21  0806 ? ? ?  ? ?History   ?Chief Complaint ?Chief Complaint  ?Patient presents with  ? Rash  ? ? ?HPI ?Patrick Travis is a 31 y.o. male.  ? ?Patient presents with a rash to the bilateral arms and hands in the bilateral eyes for 1 day.  Rash is pruritic, nondraining.  Endorses that he was completing yard work yesterday when he got exposed to a poison ivy/oak plant.  Has attempted use of a topical steroid cream which has been minimally helpful.  Denies blurred vision, light sensitivity, floaters, erythema or drainage to the eyes.  ? ?Past Medical History:  ?Diagnosis Date  ? High blood pressure   ? Urticaria   ? ? ?Patient Active Problem List  ? Diagnosis Date Noted  ? Pharyngitis 05/06/2020  ? ? ?History reviewed. No pertinent surgical history. ? ? ? ? ?Home Medications   ? ?Prior to Admission medications   ?Medication Sig Start Date End Date Taking? Authorizing Provider  ?cetirizine (ZYRTEC) 10 MG tablet Take 10 mg by mouth daily as needed for allergies.    [provider]  ?diphenhydrAMINE (BENADRYL) 25 mg capsule Take by mouth.    [provider]  ?enalapril (VASOTEC) 10 MG tablet TAKE 1 TABLET(10 MG) BY MOUTH EVERY DAY 11/18/21   Abigail Miyamoto, MD  ?EPINEPHrine 0.3 mg/0.3 mL IJ SOAJ injection STICK INTO MID FRONT THIGH IF LIGHTHEADED OR TROUBLE BREATHING IN SETTING OF ALLERGIC REACTION 07/10/18   [provider]  ?ondansetron (ZOFRAN-ODT) 8 MG disintegrating tablet Take 1 tablet (8 mg total) by mouth every 8 (eight) hours as needed for nausea or vomiting. 10/09/21   Rhys Martini, PA-C  ?predniSONE (STERAPRED UNI-PAK 21 TAB) 10 MG (21) TBPK tablet Take 6ills first day , then 5 pills day 2 and then cut down one pill day until gone 04/22/21   Abigail Miyamoto, MD  ? ? ?Family History ?Family History  ?Problem Relation Age of Onset  ? High blood pressure Mother   ? High blood pressure Father   ? ? ?Social  History ?Social History  ? ?Tobacco Use  ? Smoking status: Former  ?  Packs/day: 1.00  ?  Years: 11.00  ?  Pack years: 11.00  ?  Types: Cigarettes  ?  Quit date: 2016  ?  Years since quitting: 7.3  ? Smokeless tobacco: Never  ?Vaping Use  ? Vaping Use: Every day  ?Substance Use Topics  ? Alcohol use: Yes  ?  Comment: Seldom  ? Drug use: Never  ? ? ? ?Allergies   ?Amoxicillin ? ? ?Review of Systems ?Review of Systems  ?Constitutional: Negative.   ?Respiratory: Negative.    ?Cardiovascular: Negative.   ?Skin:  Positive for rash. Negative for color change, pallor and wound.  ?Neurological: Negative.   ? ? ?Physical Exam ?Triage Vital Signs ?ED Triage Vitals  ?Enc Vitals Group  ?   BP 12/29/21 0824 119/75  ?   Pulse Rate 12/29/21 0824 89  ?   Resp 12/29/21 0824 16  ?   Temp 12/29/21 0824 98.3 ?F (36.8 ?C)  ?   Temp Source 12/29/21 0824 Oral  ?   SpO2 12/29/21 0824 98 %  ?   Weight --   ?   Height --   ?   Head Circumference --   ?   Peak Flow --   ?   Pain Score  12/29/21 0347 2  ?   Pain Loc --   ?   Pain Edu? --   ?   Excl. in GC? --   ? ?No data found. ? ?Updated Vital Signs ?BP 119/75 (BP Location: Left Arm)   Pulse 89   Temp 98.3 ?F (36.8 ?C) (Oral)   Resp 16   SpO2 98%  ? ?Visual Acuity ?Right Eye Distance:   ?Left Eye Distance:   ?Bilateral Distance:   ? ?Right Eye Near:   ?Left Eye Near:    ?Bilateral Near:    ? ?Physical Exam ?Constitutional:   ?   Appearance: Normal appearance.  ?HENT:  ?   Head: Normocephalic.  ?Eyes:  ?   Extraocular Movements: Extraocular movements intact.  ?Pulmonary:  ?   Effort: Pulmonary effort is normal.  ?Skin: ?   Comments: Erythematous papular blisterlike rash present to the bilateral forearms and hands, erythema noted around the periorbital region of the bilateral eyes and upper cheeks  ?Neurological:  ?   Mental Status: He is alert and oriented to person, place, and time. Mental status is at baseline.  ?Psychiatric:     ?   Mood and Affect: Mood normal.     ?   Behavior:  Behavior normal.  ? ? ? ?UC Treatments / Results  ?Labs ?(all labs ordered are listed, but only abnormal results are displayed) ?Labs Reviewed - No data to display ? ?EKG ? ? ?Radiology ?No results found. ? ?Procedures ?Procedures (including critical care time) ? ?Medications Ordered in UC ?Medications - No data to display ? ?Initial Impression / Assessment and Plan / UC Course  ?I have reviewed the triage vital signs and the nursing notes. ? ?Pertinent labs & imaging results that were available during my care of the patient were reviewed by me and considered in my medical decision making (see chart for details). ? ?Poison ivy dermatitis ? ?Prednisone 60 mg taper prescribed, discussed administration, recommended a oral or topical antihistamine and topical calamine lotion for management of pruritus, recommended avoidance of touching to prevent further spread and irritation, given strict precautions to follow-up with urgent care for persistent or worsening rash ?Final Clinical Impressions(s) / UC Diagnoses  ? ?Final diagnoses:  ?None  ? ?Discharge Instructions   ?None ?  ? ?ED Prescriptions   ?None ?  ? ?PDMP not reviewed this encounter. ?  ?Valinda Hoar, NP ?12/29/21 4259 ? ?

## 2021-12-29 NOTE — Discharge Instructions (Signed)
Today you are being treated for poison ivy rash ? ?Begin use of prednisone every morning with food as directed on packaging, this medication is to stop the inflammatory process and clear your rash ? ?For itching you may use a Claritin or Zyrtec once daily or you may use topical Benadryl or calamine lotion ? ?Please avoid touching the affected areas as this will increase your risk of spreading the rash to other areas of your body ? ?Please return to urgent care for reevaluation if rash is worsening or persistent ?

## 2021-12-29 NOTE — ED Triage Notes (Signed)
Pt reports did yard work outside this weekend and got into poison oak or ivy. Has rash on right lower arm and face that is itching and little painful.  ?

## 2022-07-16 IMAGING — DX DG TOE 4TH 2+V*L*
3 series · 3 of 3 positions shown · non-contrast
Comparison: None.

CLINICAL DATA: Left fourth toe pain

EXAM:
LEFT FOURTH TOE

[toe ap]
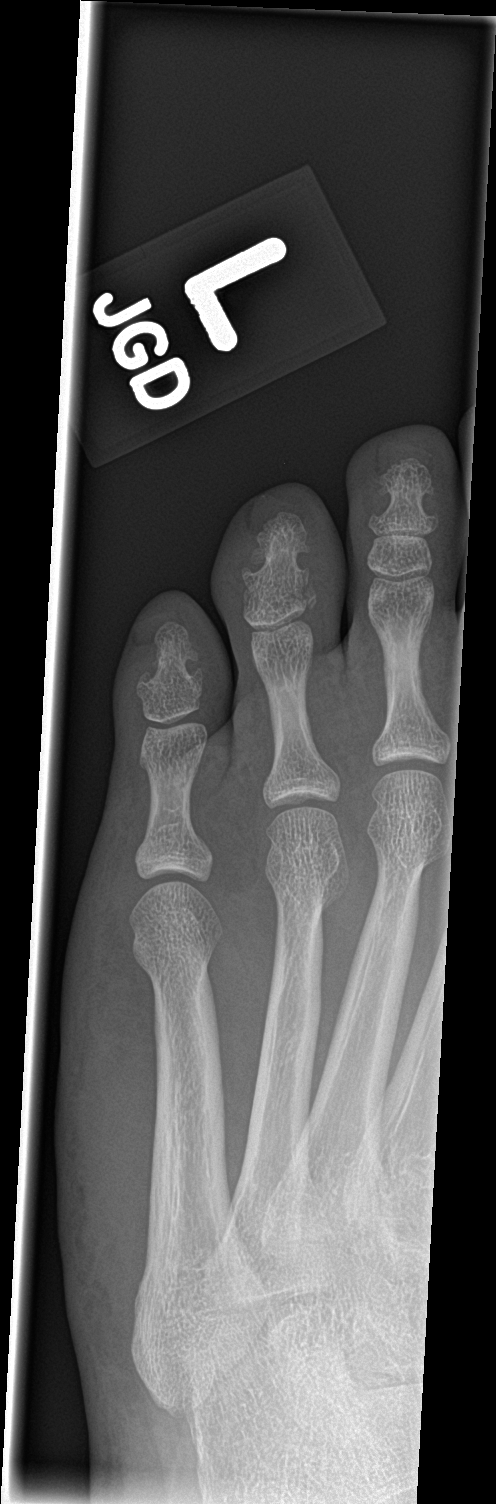

[toe obl]
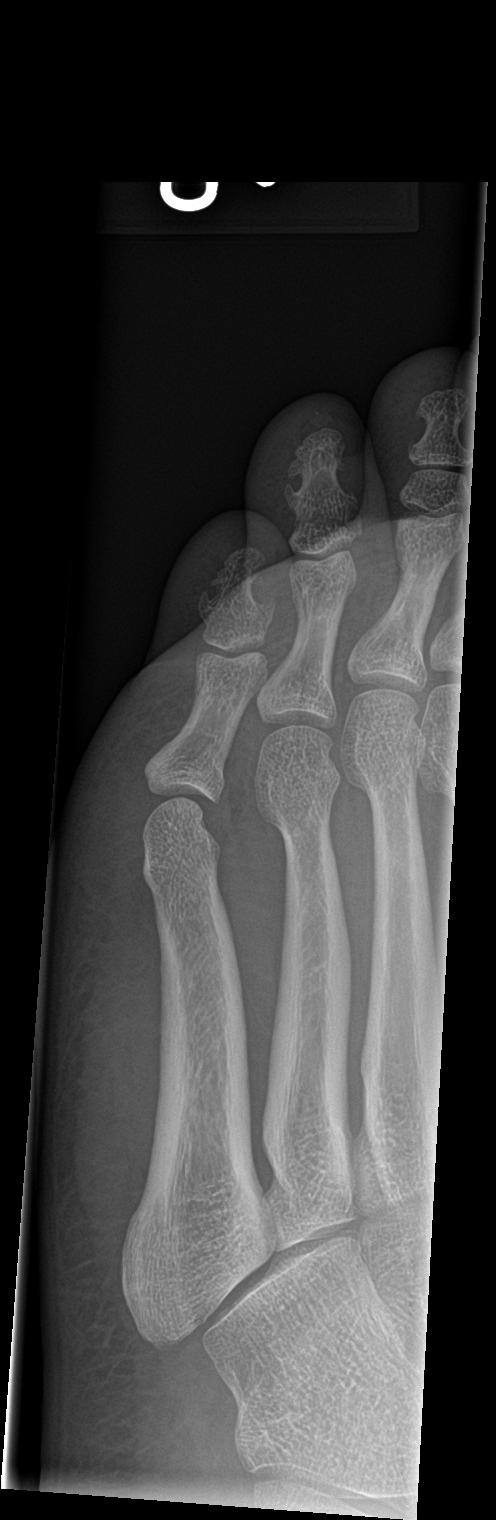

[toe lat]
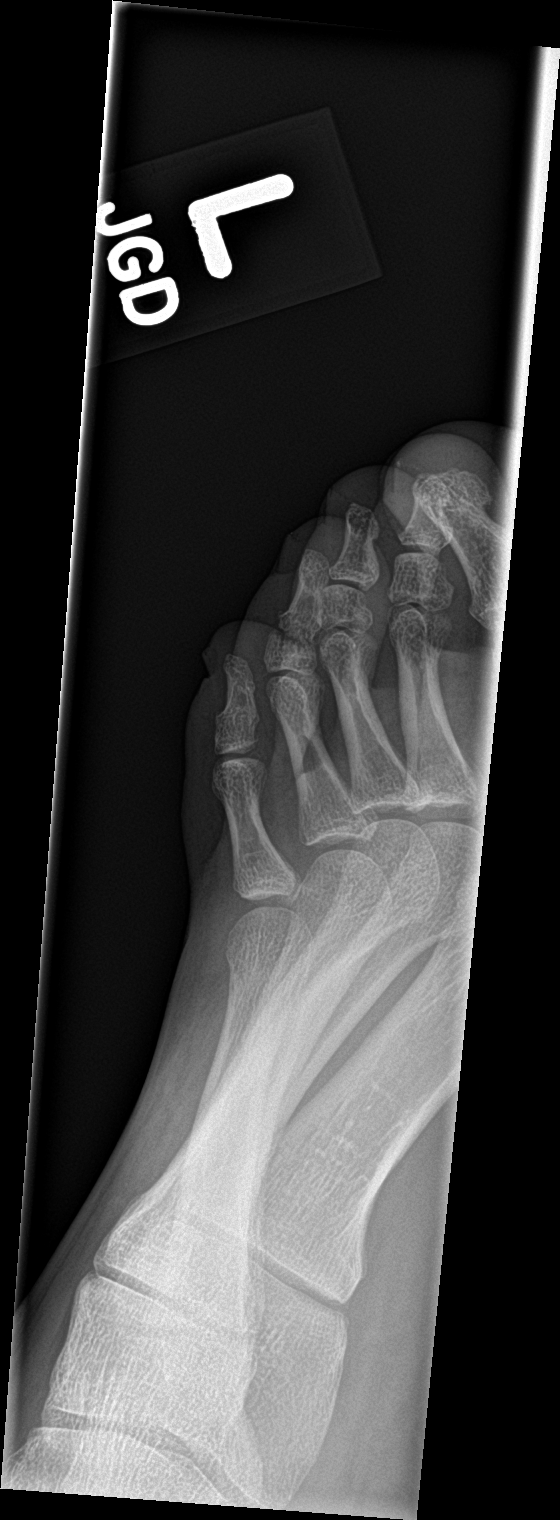

[3 of 3 positions shown; findings below may reference images not displayed]

FINDINGS: Acute nondisplaced fracture through the base of the left fourth toe
distal phalanx. No definite intra-articular extension to the
interphalangeal joint. No dislocation. No additional fractures
identified. No significant arthropathy. Soft tissues within normal
limits.
IMPRESSION: Acute nondisplaced fracture through the base of the left fourth toe
distal phalanx.

## 2022-08-23 ENCOUNTER — Other Ambulatory Visit: Payer: Self-pay | Admitting: Legal Medicine

## 2023-08-12 ENCOUNTER — Other Ambulatory Visit: Payer: Self-pay | Admitting: Legal Medicine

## 2023-08-12 MED ORDER — ENALAPRIL MALEATE 10 MG PO TABS: 10.0000 mg | ORAL_TABLET | Freq: Every day | ORAL | 2 refills | Status: DC

## 2023-08-12 NOTE — Telephone Encounter (Signed)
Copied from CRM [id number]. Topic: Clinical - Medication Refill >> Aug 12, 2023  8:41 AM Maxwell Marion wrote: Most Recent Primary Care Visit:  Provider: Abigail Miyamoto  Department: [facility]  Visit Type: MYCHART VIDEO VISIT  Date: 04/22/2021  Medication: enalapril (VASOTEC) 10 MG tablet  Has the patient contacted their pharmacy? Yes, they advised the patient to contact their doctor  (Agent: If no, request that the patient contact the pharmacy for the refill. If patient does not wish to contact the pharmacy document the reason why and proceed with request.) (Agent: If yes, when and what did the pharmacy advise?)  Is this the correct pharmacy for this prescription? Yes If no, delete pharmacy and type the correct one.  This is the patient's preferred pharmacy:  [id number] - [place], [place] - [address] AT [facility] OF [address]  [place] [address] Phone: [phone] Fax: [phone]   Has the prescription been filled recently? No  Is the patient out of the medication? Yes  Has the patient been seen for an appointment in the last year OR does the patient have an upcoming appointment?   Can we respond through MyChart?   Agent: Please be advised that Rx refills may take up to 3 business days. We ask that you follow-up with your pharmacy.

## 2023-08-16 ENCOUNTER — Telehealth: Payer: Self-pay | Admitting: Legal Medicine

## 2023-08-16 NOTE — Telephone Encounter (Signed)
LEFT VOICE MAIL FOR PT TO CALL THE OFFICE TO SCHEDULE AN OVERDUE CHRONIC APPT -- REFILLS WILL NOT BE SENT IN UNTIL HE IS SEEN

## 2024-04-19 ENCOUNTER — Other Ambulatory Visit: Payer: Self-pay | Admitting: Family Medicine

## 2024-05-25 ENCOUNTER — Telehealth: Payer: Self-pay | Admitting: Family Medicine

## 2024-05-25 MED ORDER — ENALAPRIL MALEATE 10 MG PO TABS: 10.0000 mg | ORAL_TABLET | Freq: Every day | ORAL | 2 refills | Status: DC

## 2024-05-25 NOTE — Telephone Encounter (Signed)
 Prescription Request  05/25/2024  LOV: Visit date not found  What is the name of the medication or equipment? enalapril  (VASOTEC ) 10 MG tablet   Have you contacted your pharmacy to request a refill? No   Which pharmacy would you like this sent to?  [facility] #[id number] - [place], [place] - [address] AT [facility] OF [address] [place] [address] Phone: [phone] Fax: [phone]    Patient notified that their request is being sent to the clinical staff for review and that they should receive a response within 2 business days.   Please advise at [facility] [phone]

## 2024-05-25 NOTE — Telephone Encounter (Signed)
 Request received for enalapril . See message from 2024: Patrick Travis D MY   08/16/23  4:20 PM Note LEFT VOICE MAIL FOR PT TO CALL THE OFFICE TO SCHEDULE AN OVERDUE CHRONIC APPT -- REFILLS WILL NOT BE SENT IN UNTIL HE IS SEEN

## 2024-05-25 NOTE — Telephone Encounter (Unsigned)
 Copied from CRM [id number]. Topic: Clinical - Medication Refill >> May 25, 2024  9:18 AM Donna BRAVO wrote: Medication: enalapril  (VASOTEC ) 10 MG tablet  Has the patient contacted their pharmacy? Yes No more refills new prescription needs sent  This is the patient's preferred pharmacy:   [id number] - [place], [place] - [address] AT [facility] OF [address] [place] [address] Phone: [phone] Fax: [phone]  Is this the correct pharmacy for this prescription? Yes If no, delete pharmacy and type the correct one.   Has the prescription been filled recently? No  Is the patient out of the medication? No  Has the patient been seen for an appointment in the last year OR does the patient have an upcoming appointment? Yes  Can we respond through MyChart? Yes  Agent: Please be advised that Rx refills may take up to 3 business days. We ask that you follow-up with your pharmacy.

## 2024-06-12 ENCOUNTER — Ambulatory Visit: Admitting: Physician Assistant

## 2024-06-19 ENCOUNTER — Encounter: Payer: Self-pay | Admitting: Physician Assistant

## 2024-06-19 ENCOUNTER — Ambulatory Visit (INDEPENDENT_AMBULATORY_CARE_PROVIDER_SITE_OTHER): Admitting: Physician Assistant

## 2024-06-19 VITALS — BP 114/70 | HR 73 | Temp 97.3°F | Resp 16 | Ht 69.0 in | Wt 260.0 lb

## 2024-06-19 DIAGNOSIS — F17200 Nicotine dependence, unspecified, uncomplicated: Secondary | ICD-10-CM | POA: Insufficient documentation

## 2024-06-19 DIAGNOSIS — Z13220 Encounter for screening for lipoid disorders: Secondary | ICD-10-CM | POA: Insufficient documentation

## 2024-06-19 DIAGNOSIS — F17290 Nicotine dependence, other tobacco product, uncomplicated: Secondary | ICD-10-CM

## 2024-06-19 DIAGNOSIS — I10 Essential (primary) hypertension: Secondary | ICD-10-CM | POA: Diagnosis not present

## 2024-06-19 DIAGNOSIS — G43E01 Chronic migraine with aura, not intractable, with status migrainosus: Secondary | ICD-10-CM | POA: Insufficient documentation

## 2024-06-19 DIAGNOSIS — Z136 Encounter for screening for cardiovascular disorders: Secondary | ICD-10-CM

## 2024-06-19 MED ORDER — ENALAPRIL MALEATE 10 MG PO TABS: 10.0000 mg | ORAL_TABLET | Freq: Every day | ORAL | 1 refills | Status: AC

## 2024-06-19 MED ORDER — TOPIRAMATE 25 MG PO TABS: 25.0000 mg | ORAL_TABLET | Freq: Two times a day (BID) | ORAL | 3 refills | Status: AC

## 2024-06-19 NOTE — Progress Notes (Signed)
 New Patient Office Visit  Subjective    Patient ID: Patrick Travis, male    DOB: 05-04-1991  Age: 33 y.o. MRN: [id number]  CC:  Chief Complaint  Patient presents with   New Patient (Initial Visit)    HPI Gurjot Brisco presents to establish care  Discussed the use of AI scribe software for clinical note transcription with the patient, who gave verbal consent to proceed.  History of Present Illness Tylon Kemmerling is a 33 year old male with hypertension who presents with frequent migraines.  He experiences approximately 15 migraines per month, with some occurring daily for a month and then not at all the next month. Each migraine can last a few days and significantly impacts his ability to work, especially since his job involves extensive computer use. The frequency of migraines has been consistent recently, though they were worse in the past before he got glasses. He experiences auras, described as 'rainbow-like' light changes, which occur before the onset of a migraine and last about an hour, affecting his vision. No recent increase in headache frequency or severity.  He has not used any preventive medication for migraines before, only treating them after onset. His current medication includes enalapril  for hypertension. He recently quit vaping and switched to using tobaccoless nicotine pouches. The first 48 hours after quitting vaping were difficult, but he is managing better now. He has a history of smoking, having quit cigarettes in 2016, and recently stopped vaping as of last Wednesday. He is using nicotine pouches and chewing gum to manage cravings.  No family history of diabetes. No abnormal bowel movements, blood, or mucus in stool.    Outpatient Encounter Medications as of 06/19/2024  Medication Sig   cetirizine (ZYRTEC) 10 MG tablet Take 10 mg by mouth daily as needed for allergies.   diphenhydrAMINE  (BENADRYL ) 25 mg capsule Take by mouth.   EPINEPHrine 0.3 mg/0.3 mL IJ SOAJ  injection STICK INTO MID FRONT THIGH IF LIGHTHEADED OR TROUBLE BREATHING IN SETTING OF ALLERGIC REACTION   topiramate (TOPAMAX) 25 MG tablet Take 1 tablet (25 mg total) by mouth 2 (two) times daily.   [DISCONTINUED] enalapril  (VASOTEC ) 10 MG tablet Take 10 mg by mouth daily.   enalapril  (VASOTEC ) 10 MG tablet Take 1 tablet (10 mg total) by mouth daily.   [DISCONTINUED] predniSONE  (STERAPRED UNI-PAK 21 TAB) 10 MG (21) TBPK tablet Take by mouth daily. Take 6 tabs by mouth daily  for 2 days, then 5 tabs for 2 days, then 4 tabs for 2 days, then 3 tabs for 2 days, 2 tabs for 2 days, then 1 tab by mouth daily for 2 days   No facility-administered encounter medications on file as of 06/19/2024.    Past Medical History:  Diagnosis Date   High blood pressure    Urticaria     Past Surgical History:  Procedure Laterality Date   NO PAST SURGERIES      Family History  Problem Relation Age of Onset   High blood pressure Mother    High blood pressure Father    COPD Father    Meniere's disease Father    Heart failure Maternal Grandfather    Hypertension Neg Hx     Social History   Socioeconomic History   Marital status: Single    Spouse name: Not on file   Number of children: 1   Years of education: 12   Highest education level: GED or equivalent  Occupational History   Occupation: Holiday representative  Tobacco Use   Smoking status: Former    Current packs/day: 0.00    Average packs/day: 1 pack/day for 11.0 years (11.0 ttl pk-yrs)    Types: Cigarettes    Start date: 2005    Quit date: 2016    Years since quitting: 9.7   Smokeless tobacco: Never  Vaping Use   Vaping status: Former   Start date: 01/23/2015   Quit date: 06/14/2024  Substance and Sexual Activity   Alcohol use: Yes    Comment: Seldom   Drug use: Never   Sexual activity: Not Currently  Other Topics Concern   Not on file  Social History Narrative   Not on file   Social Drivers of Health   Financial Resource Strain:  Not on file  Food Insecurity: Not on file  Transportation Needs: Not on file  Physical Activity: Not on file  Stress: Not on file  Social Connections: Not on file  Intimate Partner Violence: Not on file    Review of Systems  Constitutional:  Negative for chills, fever and malaise/fatigue.  HENT:  Negative for congestion, hearing loss and sinus pain.   Respiratory:  Negative for shortness of breath and wheezing.   Cardiovascular:  Negative for palpitations, claudication and leg swelling.  Gastrointestinal:  Negative for abdominal pain, constipation, diarrhea and melena.  Genitourinary:  Negative for dysuria, frequency and urgency.  Neurological:  Negative for dizziness, sensory change, seizures and headaches.  Psychiatric/Behavioral:  Negative for depression and suicidal ideas.         Objective    BP 114/70   Pulse 73   Temp (!) 97.3 F (36.3 C)   Resp 16   Ht 5' 9 (1.753 m)   Wt 260 lb (117.9 kg)   SpO2 97%   BMI 38.40 kg/m   Physical Exam Constitutional:      Appearance: Normal appearance.  HENT:     Right Ear: Tympanic membrane normal.     Left Ear: Tympanic membrane normal.     Nose: Nose normal.     Mouth/Throat:     Pharynx: No oropharyngeal exudate or posterior oropharyngeal erythema.  Eyes:     Conjunctiva/sclera: Conjunctivae normal.  Neck:     Vascular: No carotid bruit.  Cardiovascular:     Rate and Rhythm: Normal rate and regular rhythm.     Heart sounds: Normal heart sounds.  Pulmonary:     Effort: Pulmonary effort is normal.     Breath sounds: Normal breath sounds.  Abdominal:     General: Bowel sounds are normal.     Palpations: Abdomen is soft.     Tenderness: There is no abdominal tenderness.  Skin:    Findings: No lesion or rash.  Neurological:     Mental Status: He is alert and oriented to person, place, and time.  Psychiatric:        Behavior: Behavior normal.         Assessment & Plan:   Problem List Items Addressed This  Visit     Chronic migraine with aura and with status migrainosus, not intractable   Migraine with aura Chronic migraines with aura, 15 times monthly, affecting productivity. Teeth clenching and stress may contribute. Auras last about an hour. - Prescribe Topamax 25 mg daily, increase to 25 mg twice daily if tolerated, goal 50 mg twice daily. - Discuss Topamax side effects: nausea, diarrhea, dizziness, numbness, tingling. - Order blood work in three months to monitor liver function. - Advise reporting side  effects or liver symptoms: right-sided abdominal pain, black stools, persistent nausea/vomiting. - Discuss alternative prevention: Ubrelvy, Nurtec if Topamax ineffective or intolerable.      Relevant Medications   enalapril  (VASOTEC ) 10 MG tablet   topiramate (TOPAMAX) 25 MG tablet   Other Relevant Orders   CBC with Differential/Platelet   Comprehensive metabolic panel with GFR   Primary hypertension - Primary   Essential hypertension Blood pressure controlled at 114/80 mmHg. - Continue enalapril  10mg  BP Readings from Last 3 Encounters:  06/19/24 114/70  12/29/21 119/75  10/09/21 138/89         Relevant Medications   enalapril  (VASOTEC ) 10 MG tablet   Other Relevant Orders   TSH   Hemoglobin A1c   Encounter for lipid screening for cardiovascular disease   Relevant Orders   Lipid panel   Nicotine dependence   Nicotine dependence in remission Nicotine dependence in remission after quitting vaping. Using tobaccoless nicotine pouches. Initial withdrawal improving. - Advise rotating nicotine pouch to prevent oral sores, gum recession. - Recommend dentist visit for oral sores or gum recession.     General Health Maintenance Declined flu shot and tetanus update. Blood work planned for health assessment. - Perform blood work: blood count, kidney/liver function, cholesterol, thyroid, A1c. - Discuss blood work results once available.  Follow-Up Follow-up planned to assess  medication efficacy and monitor side effects. - Schedule follow-up in three months for Topamax effectiveness and liver function. - Advise contacting via MyChart for side effects or concerns before follow-up.   Return in about 3 months (around 09/19/2024) for Chronic, Nola.   Nola Angles, [place]

## 2024-06-19 NOTE — Assessment & Plan Note (Signed)
 Migraine with aura Chronic migraines with aura, 15 times monthly, affecting productivity. Teeth clenching and stress may contribute. Auras last about an hour. - Prescribe Topamax 25 mg daily, increase to 25 mg twice daily if tolerated, goal 50 mg twice daily. - Discuss Topamax side effects: nausea, diarrhea, dizziness, numbness, tingling. - Order blood work in three months to monitor liver function. - Advise reporting side effects or liver symptoms: right-sided abdominal pain, black stools, persistent nausea/vomiting. - Discuss alternative prevention: Ubrelvy, Nurtec if Topamax ineffective or intolerable.

## 2024-06-19 NOTE — Assessment & Plan Note (Signed)
 Essential hypertension Blood pressure controlled at 114/80 mmHg. - Continue enalapril  10mg  BP Readings from Last 3 Encounters:  06/19/24 114/70  12/29/21 119/75  10/09/21 138/89

## 2024-06-19 NOTE — Assessment & Plan Note (Signed)
 Nicotine dependence in remission Nicotine dependence in remission after quitting vaping. Using tobaccoless nicotine pouches. Initial withdrawal improving. - Advise rotating nicotine pouch to prevent oral sores, gum recession. - Recommend dentist visit for oral sores or gum recession.

## 2024-06-20 LAB — CBC WITH DIFFERENTIAL/PLATELET
Basophils Absolute: 0.1 x10E3/uL (ref 0.0–0.2)
Basos: 1 %
EOS (ABSOLUTE): 0.2 x10E3/uL (ref 0.0–0.4)
Eos: 2 %
Hematocrit: 50.2 % (ref 37.5–51.0)
Hemoglobin: 17.1 g/dL (ref 13.0–17.7)
Immature Grans (Abs): 0 x10E3/uL (ref 0.0–0.1)
Immature Granulocytes: 0 %
Lymphocytes Absolute: 1.8 x10E3/uL (ref 0.7–3.1)
Lymphs: 19 %
MCH: 30.5 pg (ref 26.6–33.0)
MCHC: 34.1 g/dL (ref 31.5–35.7)
MCV: 90 fL (ref 79–97)
Monocytes Absolute: 0.7 x10E3/uL (ref 0.1–0.9)
Monocytes: 8 %
Neutrophils Absolute: 6.6 x10E3/uL (ref 1.4–7.0)
Neutrophils: 70 %
Platelets: 225 x10E3/uL (ref 150–450)
RBC: 5.61 x10E6/uL (ref 4.14–5.80)
RDW: 12.7 % (ref 11.6–15.4)
WBC: 9.4 x10E3/uL (ref 3.4–10.8)

## 2024-06-20 LAB — COMPREHENSIVE METABOLIC PANEL WITH GFR
ALT: 36 IU/L (ref 0–44)
AST: 21 IU/L (ref 0–40)
Albumin: 4.7 g/dL (ref 4.1–5.1)
Alkaline Phosphatase: 106 IU/L (ref 47–123)
BUN/Creatinine Ratio: 15 (ref 9–20)
BUN: 17 mg/dL (ref 6–20)
Bilirubin Total: 0.4 mg/dL (ref 0.0–1.2)
CO2: 23 mmol/L (ref 20–29)
Calcium: 9.5 mg/dL (ref 8.7–10.2)
Chloride: 102 mmol/L (ref 96–106)
Creatinine, Ser: 1.15 mg/dL (ref 0.76–1.27)
Globulin, Total: 2.9 g/dL (ref 1.5–4.5)
Glucose: 93 mg/dL (ref 70–99)
Potassium: 4.4 mmol/L (ref 3.5–5.2)
Sodium: 138 mmol/L (ref 134–144)
Total Protein: 7.6 g/dL (ref 6.0–8.5)
eGFR: 86 mL/min/1.73 (ref >59)

## 2024-06-20 LAB — LIPID PANEL
Chol/HDL Ratio: 4.1 ratio (ref 0.0–5.0)
Cholesterol, Total: 153 mg/dL (ref 100–199)
HDL: 37 mg/dL — ABNORMAL LOW (ref >39)
LDL Chol Calc (NIH): 89 mg/dL (ref 0–99)
Triglycerides: 157 mg/dL — ABNORMAL HIGH (ref 0–149)
VLDL Cholesterol Cal: 27 mg/dL (ref 5–40)

## 2024-06-20 LAB — HEMOGLOBIN A1C
Est. average glucose Bld gHb Est-mCnc: 100 mg/dL
Hgb A1c MFr Bld: 5.1 % (ref 4.8–5.6)

## 2024-06-20 LAB — TSH: TSH: 1.84 u[IU]/mL (ref 0.450–4.500)

## 2024-06-21 ENCOUNTER — Ambulatory Visit: Payer: Self-pay | Admitting: Physician Assistant

## 2024-09-20 ENCOUNTER — Ambulatory Visit: Admitting: Physician Assistant
